# Patient Record
Sex: Female | Born: 1945 | Hispanic: Yes | Marital: Married | Smoking: Never smoker
Health system: Southern US, Community
[De-identification: ages and names within clinical notes are randomized; demographics above are authoritative.]

---

## 2020-10-17 ENCOUNTER — Emergency Department: Payer: BC Managed Care – PPO

## 2020-10-17 ENCOUNTER — Other Ambulatory Visit: Payer: Self-pay

## 2020-10-17 ENCOUNTER — Emergency Department
Admission: EM | Admit: 2020-10-17 | Discharge: 2020-10-17 | Disposition: A | Discharge status: Home / Self Care | Payer: BC Managed Care – PPO | Attending: Emergency Medicine | Admitting: Emergency Medicine

## 2020-10-17 DIAGNOSIS — R1012 Left upper quadrant pain: Secondary | ICD-10-CM | POA: Insufficient documentation

## 2020-10-17 DIAGNOSIS — R1030 Lower abdominal pain, unspecified: Secondary | ICD-10-CM

## 2020-10-17 DIAGNOSIS — I10 Essential (primary) hypertension: Secondary | ICD-10-CM | POA: Insufficient documentation

## 2020-10-17 DIAGNOSIS — R109 Unspecified abdominal pain: Secondary | ICD-10-CM

## 2020-10-17 DIAGNOSIS — R1032 Left lower quadrant pain: Secondary | ICD-10-CM | POA: Diagnosis not present

## 2020-10-17 LAB — COMPREHENSIVE METABOLIC PANEL
ALT: 17 U/L (ref 0–44)
AST: 17 U/L (ref 15–41)
Albumin: 4 g/dL (ref 3.5–5.0)
Alkaline Phosphatase: 79 U/L (ref 38–126)
Anion gap: 8 (ref 5–15)
BUN: 17 mg/dL (ref 8–23)
CO2: 27 mmol/L (ref 22–32)
Calcium: 9.2 mg/dL (ref 8.9–10.3)
Chloride: 107 mmol/L (ref 98–111)
Creatinine, Ser: 0.66 mg/dL (ref 0.44–1.00)
GFR, Estimated: >60 mL/min (ref >60)
Glucose, Bld: 116 mg/dL — ABNORMAL HIGH (ref 70–99)
Potassium: 3.6 mmol/L (ref 3.5–5.1)
Sodium: 142 mmol/L (ref 135–145)
Total Bilirubin: 0.7 mg/dL (ref 0.3–1.2)
Total Protein: 7.7 g/dL (ref 6.5–8.1)

## 2020-10-17 LAB — URINALYSIS, COMPLETE (UACMP) WITH MICROSCOPIC
Bacteria, UA: NONE SEEN
Bilirubin Urine: NEGATIVE
Glucose, UA: NEGATIVE mg/dL
Hgb urine dipstick: NEGATIVE
Ketones, ur: NEGATIVE mg/dL
Leukocytes,Ua: NEGATIVE
Nitrite: NEGATIVE
Protein, ur: NEGATIVE mg/dL
Specific Gravity, Urine: 1.016 (ref 1.005–1.030)
pH: 5 (ref 5.0–8.0)

## 2020-10-17 LAB — CBC
HCT: 40.4 % (ref 36.0–46.0)
Hemoglobin: 13.5 g/dL (ref 12.0–15.0)
MCH: 27.8 pg (ref 26.0–34.0)
MCHC: 33.4 g/dL (ref 30.0–36.0)
MCV: 83.1 fL (ref 80.0–100.0)
Platelets: 236 10*3/uL (ref 150–400)
RBC: 4.86 MIL/uL (ref 3.87–5.11)
RDW: 12.4 % (ref 11.5–15.5)
WBC: 6.8 10*3/uL (ref 4.0–10.5)
nRBC: 0 % (ref 0.0–0.2)

## 2020-10-17 LAB — LIPASE, BLOOD: Lipase: 38 U/L (ref 11–51)

## 2020-10-17 LAB — TROPONIN I (HIGH SENSITIVITY): Troponin I (High Sensitivity): 4 ng/L (ref <18)

## 2020-10-17 IMAGING — US US ABDOMEN LIMITED
1 series · 14 of 25 positions shown · non-contrast
Comparison: None.

CLINICAL DATA: 74-year-old female with upper abdominal pain.

EXAM:
ULTRASOUND ABDOMEN LIMITED RIGHT UPPER QUADRANT

[Series 1: us abdomen limited ruq (liver/gb) · 14 of 38 slices shown]
[im 1/38]
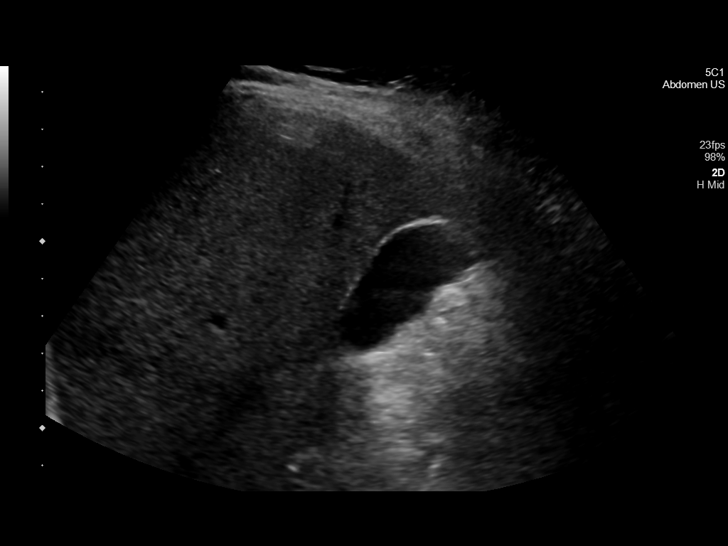
[im 4/38]
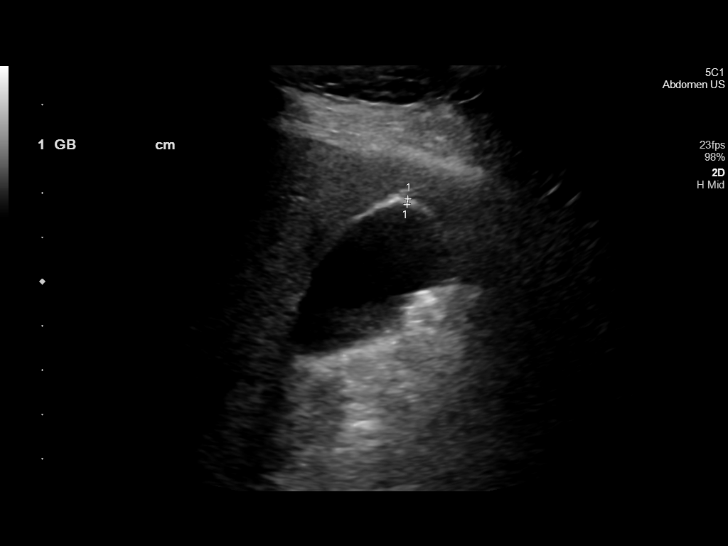
[im 7/38]
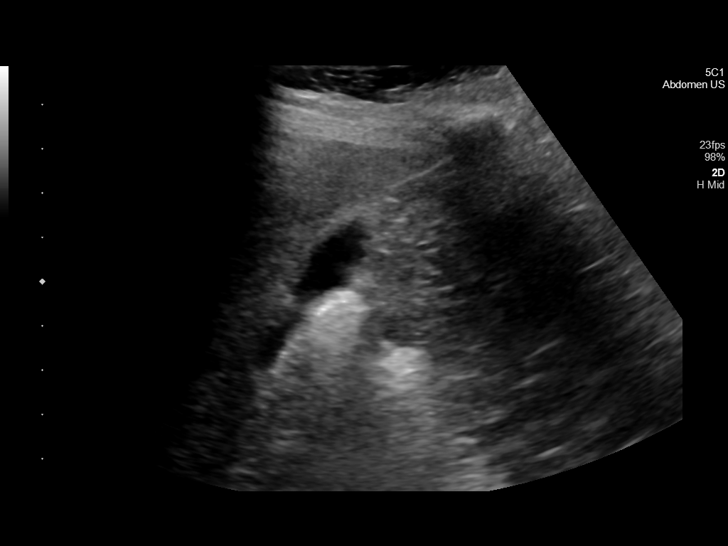
[im 10/38]
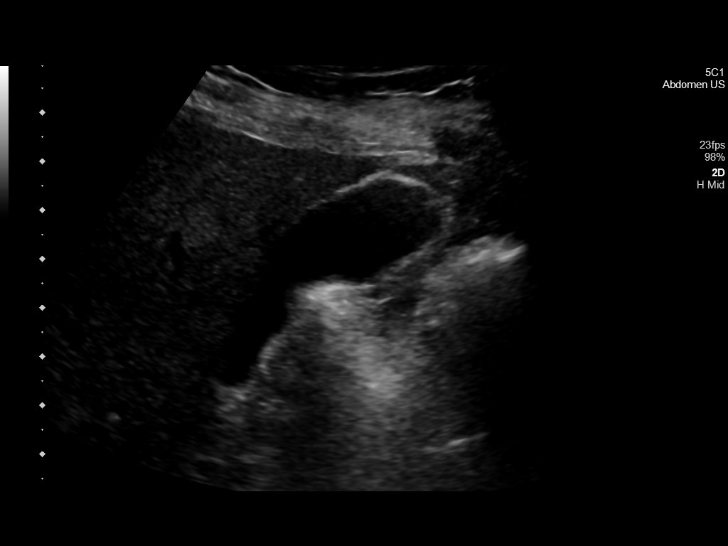
[im 13/38]
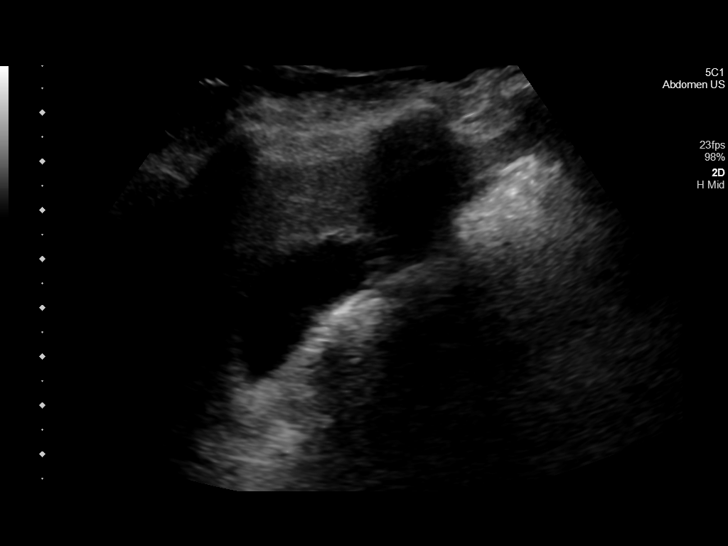
[im 14/38]
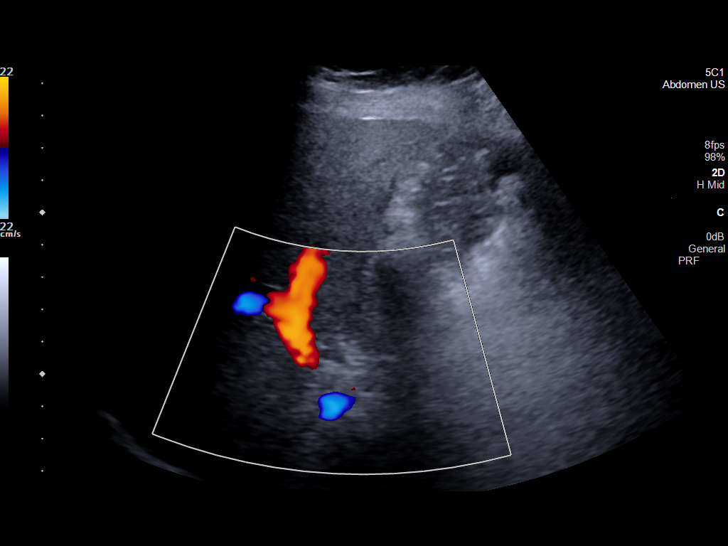
[im 17/38]
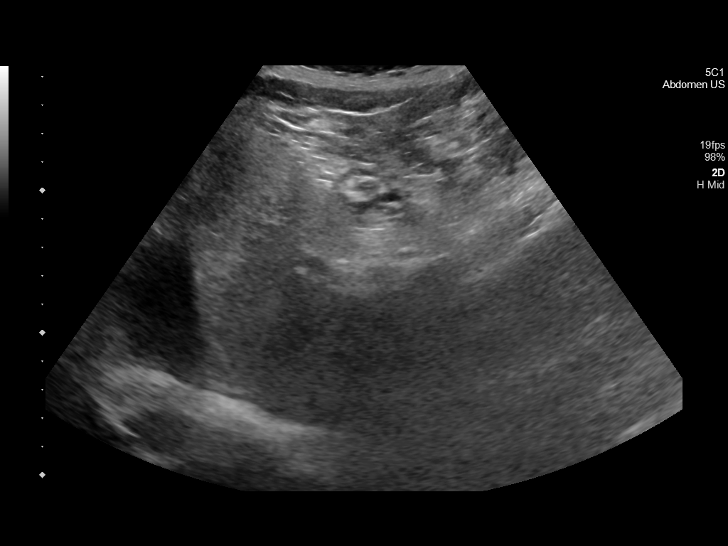
[im 21/38]
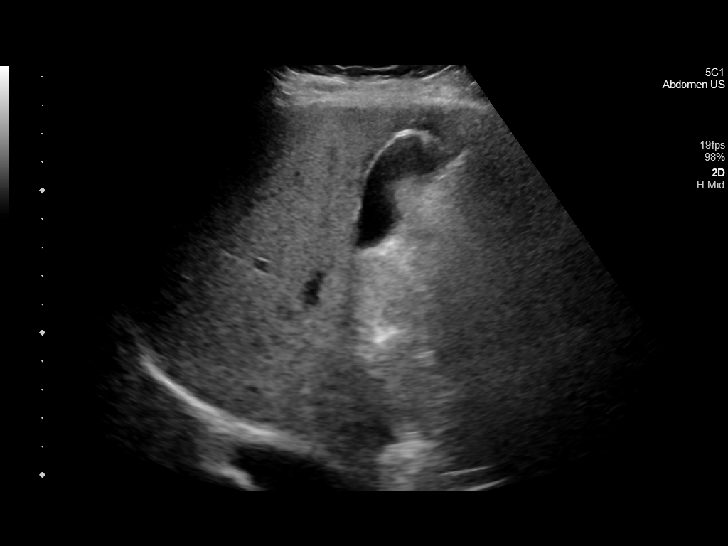
[im 24/38]
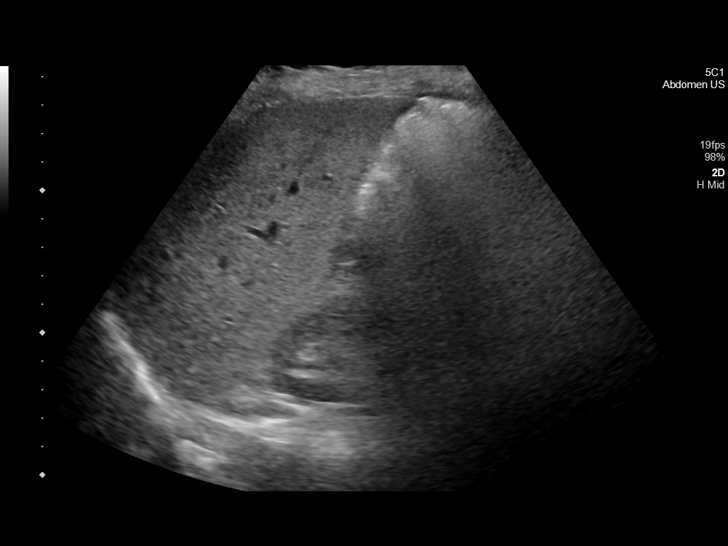
[im 25/38]
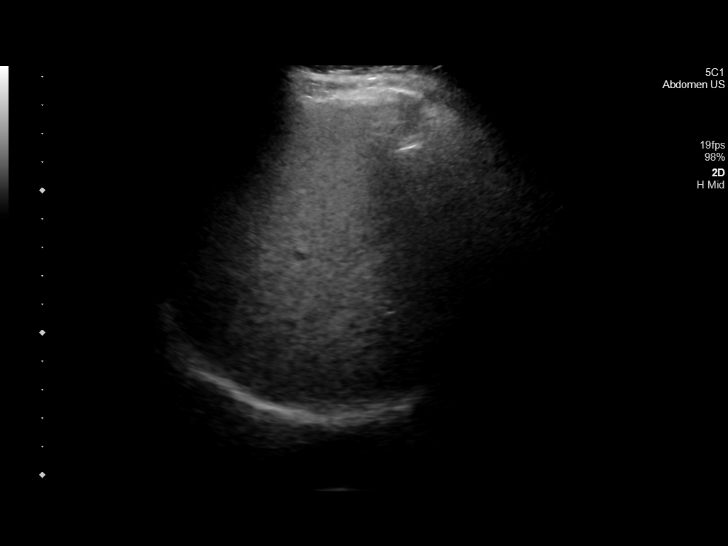
[im 28/38]
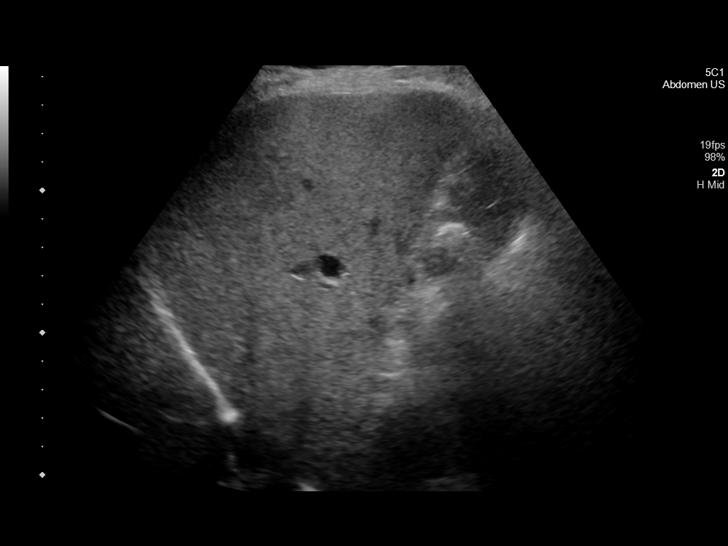
[im 31/38]
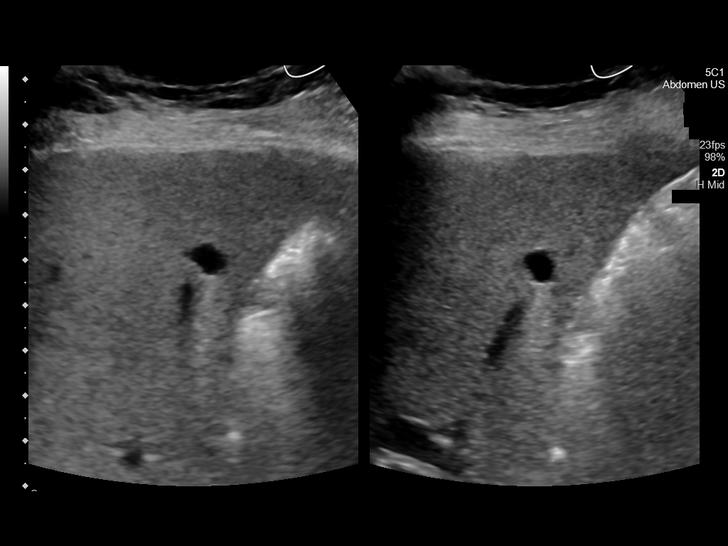
[im 34/38]
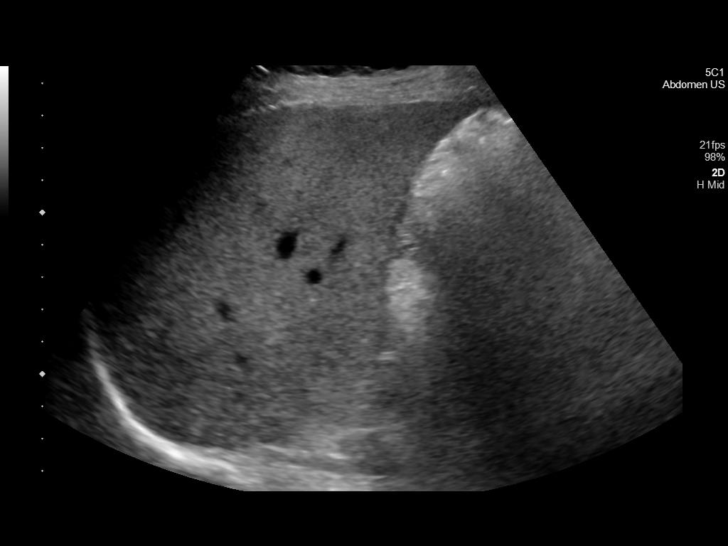
[im 38/38]
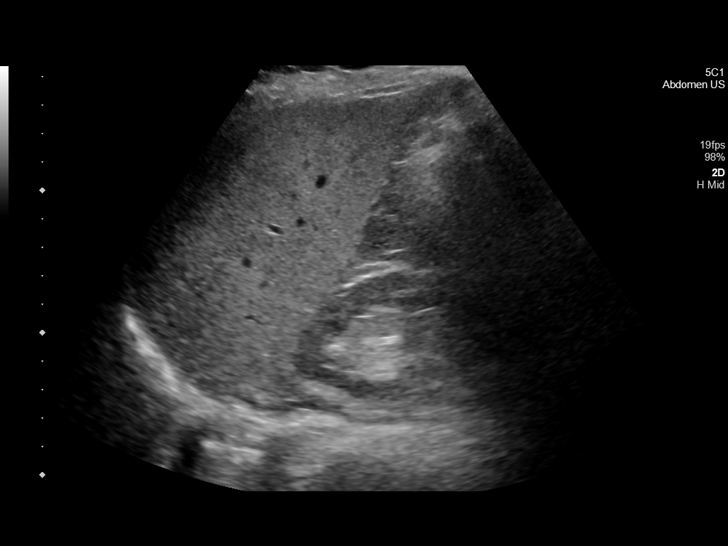

[14 of 25 positions shown; findings below may reference images not displayed]

FINDINGS: Gallbladder:

No gallstones or wall thickening visualized. No sonographic Murphy
sign noted by sonographer.

Common bile duct:

Diameter: 3 mm

Liver:

There is diffuse increased liver echogenicity most commonly seen in
the setting of fatty infiltration. Superimposed inflammation or
fibrosis is not excluded. Clinical correlation is recommended. There
is a subcentimeter cyst in the right lobe of the liver. Portal vein
is patent on color Doppler imaging with normal direction of blood
flow towards the liver.

Other: None.
IMPRESSION: 1. Fatty liver and a subcentimeter liver cysts.
2. No gallstone.

## 2020-10-17 MED ORDER — FENTANYL CITRATE (PF) 100 MCG/2ML IJ SOLN
50.0000 ug | Freq: Once | INTRAMUSCULAR | Status: AC
  Administered 2020-10-17: 50 ug via INTRAVENOUS
  Filled 2020-10-17: qty 2

## 2020-10-17 MED ORDER — LACTATED RINGERS IV BOLUS
500.0000 mL | Freq: Once | INTRAVENOUS | Status: AC
  Administered 2020-10-17: 500 mL via INTRAVENOUS

## 2020-10-17 MED ORDER — IOHEXOL 300 MG/ML  SOLN
100.0000 mL | Freq: Once | INTRAMUSCULAR | Status: AC | PRN
  Administered 2020-10-17: 100 mL via INTRAVENOUS

## 2020-10-17 NOTE — ED Provider Notes (Signed)
[facility] Emergency Department Provider Note  ____________________________________________   First MD Initiated Contact with Patient 10/17/20 1150     (approximate)  I have reviewed the triage vital signs and the nursing notes.   HISTORY  Chief Complaint Abdominal Pain   HPI Taylor Hernandez is a 74 y.o. female  with PMHx elevated BP, BPPV, allergic rhinitis who presents for assessment approximately 3 to 4 days of left upper and left lower abdominal pain.  No prior similar episodes.  No clear alleviating or aggravating factors.  Patient denies any headache, earache, sore throat, fevers, chills, cough, shortness of breath, chest pain, back pain, burning with urination, diarrhea, dysuria, rash, extremity pain, other acute complaints.  States he had a bowel movement yesterday that was a little harder than usual but otherwise has been stooling normally.  Denies EtOH or illicit drug use.  States she has had elevated blood pressure in the past but is not currently on any medications for this.      History reviewed. No pertinent past medical history.  There are no problems to display for this patient.   Past Surgical History:  Procedure Laterality Date  . CESAREAN SECTION      Prior to Admission medications   Not on File    Allergies Patient has no known allergies.  No family history on file.  Social History Social History   Tobacco Use  . Smoking status: Never Smoker  . Smokeless tobacco: Never Used  Substance Use Topics  . Alcohol use: Never  . Drug use: Not on file    Review of Systems  Review of Systems  Constitutional: Negative for chills and fever.  HENT: Negative for sore throat.   Eyes: Negative for pain.  Respiratory: Negative for cough and stridor.   Cardiovascular: Negative for chest pain.  Gastrointestinal: Positive for abdominal pain. Negative for vomiting.  Genitourinary: Negative for dysuria.  Musculoskeletal: Negative for  myalgias.  Skin: Negative for rash.  Neurological: Negative for seizures, loss of consciousness and headaches.  Psychiatric/Behavioral: Negative for suicidal ideas.  All other systems reviewed and are negative.     ____________________________________________   PHYSICAL EXAM:  VITAL SIGNS: ED Triage Vitals  Enc Vitals Group     BP 10/17/20 0228 (S) (!) 197/81     Pulse Rate 10/17/20 0228 98     Resp 10/17/20 0228 18     Temp 10/17/20 0228 97.6 F (36.4 C)     Temp src --      SpO2 10/17/20 0228 99 %     Weight 10/17/20 0224 135 lb (61.2 kg)     Height 10/17/20 0224 4\' 11"  (1.499 m)     Head Circumference --      Peak Flow --      Pain Score 10/17/20 0223 7     Pain Loc --      Pain Edu? --      Excl. in GC? --    Vitals:   10/17/20 1225 10/17/20 1230  BP: (!) 154/75 (!) 155/71  Pulse: 81 78  Resp: 16 12  Temp:    SpO2: 95% 99%   Physical Exam Vitals and nursing note reviewed.  Constitutional:      General: She is not in acute distress.    Appearance: She is well-developed.  HENT:     Head: Normocephalic and atraumatic.     Right Ear: External ear normal.     Left Ear: External ear normal.  Nose: Nose normal.  Eyes:     Conjunctiva/sclera: Conjunctivae normal.  Cardiovascular:     Rate and Rhythm: Normal rate and regular rhythm.     Heart sounds: No murmur heard.   Pulmonary:     Effort: Pulmonary effort is normal. No respiratory distress.     Breath sounds: Normal breath sounds.  Abdominal:     Palpations: Abdomen is soft.     Tenderness: There is abdominal tenderness in the left upper quadrant and left lower quadrant. There is no right CVA tenderness or left CVA tenderness.  Musculoskeletal:     Cervical back: Neck supple.  Skin:    General: Skin is warm and dry.     Capillary Refill: Capillary refill takes less than 2 seconds.  Neurological:     Mental Status: She is alert and oriented to person, place, and time.  Psychiatric:        Mood and  Affect: Mood normal.      ____________________________________________   LABS (all labs ordered are listed, but only abnormal results are displayed)  Labs Reviewed  COMPREHENSIVE METABOLIC PANEL - Abnormal; Notable for the following components:      Result Value   Glucose, Bld 116 (*)    All other components within normal limits  URINALYSIS, COMPLETE (UACMP) WITH MICROSCOPIC - Abnormal; Notable for the following components:   Color, Urine YELLOW (*)    APPearance CLEAR (*)    All other components within normal limits  LIPASE, BLOOD  CBC  TROPONIN I (HIGH SENSITIVITY)   ____________________________________________  EKG  Sinus rhythm with a ventricular rate of 86, normal axis, nonspecific changes in leads III, V5, and V6 with no other clear evidence of acute ischemia.  Normal intervals. ____________________________________________  RADIOLOGY  ED MD interpretation: No evidence of cholelithiasis.  Official radiology report(s): CT ABDOMEN PELVIS W CONTRAST  Result Date: 10/17/2020 CLINICAL DATA:  Left abdominal pain EXAM: CT ABDOMEN AND PELVIS WITH CONTRAST TECHNIQUE: Multidetector CT imaging of the abdomen and pelvis was performed using the standard protocol following bolus administration of intravenous contrast. CONTRAST:  OMNIPAQUE IOHEXOL 300 MG/ML  SOLN COMPARISON:  None. FINDINGS: Lower chest: No acute abnormality. Hepatobiliary: Too small to characterize hypoattenuating lesion of the inferior right hepatic lobe. Gallbladder is unremarkable. There is no biliary dilatation. Pancreas: Unremarkable. Spleen: Unremarkable. Adrenals/Urinary Tract: Adrenals, kidneys, and bladder are unremarkable Stomach/Bowel: Stomach is within normal limits. Bowel is normal in caliber. Vascular/Lymphatic: Minor aortic atherosclerosis. No enlarged lymph nodes identified. Reproductive: Uterus and bilateral adnexa are unremarkable. Other: No ascites.  No acute abnormality of the abdominal wall.  Musculoskeletal: Lumbar spine degenerative changes. No acute osseous abnormality. IMPRESSION: No acute abnormality or findings to account for reported symptoms. Electronically Signed   By: Guadlupe Spanish M.D.   On: 10/17/2020 12:58   US ABDOMEN LIMITED RUQ (LIVER/GB)  Result Date: 10/17/2020 CLINICAL DATA:  74 year old female with upper abdominal pain. EXAM: ULTRASOUND ABDOMEN LIMITED RIGHT UPPER QUADRANT COMPARISON:  None. FINDINGS: Gallbladder: No gallstones or wall thickening visualized. No sonographic Murphy sign noted by sonographer. Common bile duct: Diameter: 3 mm Liver: There is diffuse increased liver echogenicity most commonly seen in the setting of fatty infiltration. Superimposed inflammation or fibrosis is not excluded. Clinical correlation is recommended. There is a subcentimeter cyst in the right lobe of the liver. Portal vein is patent on color Doppler imaging with normal direction of blood flow towards the liver. Other: None. IMPRESSION: 1. Fatty liver and a subcentimeter liver cysts. 2.  No gallstone. Electronically Signed   By: Elgie Collard M.D.   On: 10/17/2020 03:52    ____________________________________________   PROCEDURES  Procedure(s) performed (including Critical Care):  .1-3 Lead EKG Interpretation Performed by: Gilles Chiquito, MD Authorized by: Gilles Chiquito, MD     Interpretation: normal     ECG rate assessment: normal     Rhythm: sinus rhythm     Ectopy: none     Conduction: normal       ____________________________________________   INITIAL IMPRESSION / ASSESSMENT AND PLAN / ED COURSE        Patient presents with [place] to history exam for assessment of some left-sided abdominal pain has been ongoing over the last several days.  Patient is hypertensive with a BP of 197/81 with otherwise stable vital signs on room air.  Exam as above remarkable for some tenderness in left upper and left lower quadrants.  Differential includes but is not limited  to ACS, AAA, diverticulitis, stone, pyelonephritis, appendicitis, splenic infarct, gastritis, and cystitis.  Low suspicion for ACS given patient denies any chest pain has a reassuring EKG with nonelevated troponin.  Ultrasound obtained in triage shows no evidence of cholelithiasis or other abnormalities in the right upper quadrant other than some steatosis with possible cyst.  Lipase is not consistent with acute pancreatitis.  UA does not appear infected and exam is not consistent with pyelonephritis.  CT abdomen pelvis shows no evidence of pyelonephritis, diverticulitis, splenic injury, SBO, appendicitis, or other acute intra-abdominal pathology.  Given patient did report some very hard stools yesterday is possible she has some gas pain and constipation.  Advised patient to take some MiraLAX and follow-up with her PCP in the next 2 to 3 days.  Given she states she felt somewhat better on my reassessment with otherwise reassuring vital signs and work-up believe she is a for discharge with plan for close outpatient follow-up.  Discharge stable condition.  Strict return precautions advised and discussed.  Also advised patient that she was hypertensive today and should have her blood pressure rechecked by her PCP on her follow-up visit.  ____________________________________________   FINAL CLINICAL IMPRESSION(S) / ED DIAGNOSES  Final diagnoses:  Lower abdominal pain  Abdominal pain, unspecified abdominal location  Hypertension, unspecified type    Medications  fentaNYL (SUBLIMAZE) injection 50 mcg (50 mcg Intravenous Given 10/17/20 1223)  lactated ringers bolus 500 mL (500 mLs Intravenous New Bag/Given 10/17/20 1220)  iohexol (OMNIPAQUE) 300 MG/ML solution 100 mL (100 mLs Intravenous Contrast Given 10/17/20 1242)     ED Discharge Orders    None       Note:  This document was prepared using Dragon voice recognition software and may include unintentional dictation errors.   Gilles Chiquito, MD 10/17/20 1314

## 2020-10-17 NOTE — ED Triage Notes (Signed)
Patient c/o upper left abdominal pain and swelling X 4 days. Patient denies N/V/D.

## 2023-01-10 ENCOUNTER — Telehealth: Payer: Self-pay | Admitting: General Practice

## 2023-01-10 NOTE — Telephone Encounter (Signed)
Patient husband called in and was wanting to know if Dr. Darnell Level would take him and his wife on as new patients. He stated that right now they currently go to [facility] and are looking for a spanish speaking doctor closer to home. He stated that someone had gave them his card a while back. Please advise. Thank you!

## 2023-01-23 NOTE — Telephone Encounter (Signed)
Pt's husband called to check status of Dr. Darnell Level accepting pt & husband as new pts. Call back # [phone]

## 2023-01-23 NOTE — Telephone Encounter (Signed)
Unfortunately I'm not taking on new patients at this time.

## 2023-01-24 NOTE — Telephone Encounter (Signed)
Told pt's husband Dr. Synthia Innocent response, no questions/concerns .

## 2023-09-23 ENCOUNTER — Other Ambulatory Visit: Payer: Self-pay

## 2023-09-23 ENCOUNTER — Emergency Department (HOSPITAL_COMMUNITY): Payer: BC Managed Care – PPO

## 2023-09-23 ENCOUNTER — Encounter (HOSPITAL_COMMUNITY): Payer: Self-pay

## 2023-09-23 ENCOUNTER — Emergency Department (HOSPITAL_COMMUNITY)
Admission: EM | Admit: 2023-09-23 | Discharge: 2023-09-24 | Disposition: A | Discharge status: Home / Self Care | Payer: BC Managed Care – PPO | Attending: Emergency Medicine | Admitting: Emergency Medicine

## 2023-09-23 DIAGNOSIS — W01198A Fall on same level from slipping, tripping and stumbling with subsequent striking against other object, initial encounter: Secondary | ICD-10-CM | POA: Diagnosis not present

## 2023-09-23 DIAGNOSIS — S0083XA Contusion of other part of head, initial encounter: Secondary | ICD-10-CM | POA: Insufficient documentation

## 2023-09-23 DIAGNOSIS — S0011XA Contusion of right eyelid and periocular area, initial encounter: Secondary | ICD-10-CM | POA: Diagnosis not present

## 2023-09-23 DIAGNOSIS — S43004A Unspecified dislocation of right shoulder joint, initial encounter: Secondary | ICD-10-CM

## 2023-09-23 DIAGNOSIS — S43014A Anterior dislocation of right humerus, initial encounter: Secondary | ICD-10-CM | POA: Insufficient documentation

## 2023-09-23 DIAGNOSIS — W19XXXA Unspecified fall, initial encounter: Secondary | ICD-10-CM

## 2023-09-23 DIAGNOSIS — S4991XA Unspecified injury of right shoulder and upper arm, initial encounter: Secondary | ICD-10-CM | POA: Diagnosis present

## 2023-09-23 MED ORDER — IBUPROFEN 400 MG PO TABS
400.0000 mg | ORAL_TABLET | Freq: Once | ORAL | Status: AC | PRN
  Administered 2023-09-23: 400 mg via ORAL
  Filled 2023-09-23: qty 1

## 2023-09-23 NOTE — ED Triage Notes (Signed)
Pt coming to ED after falling face forward onto floor in hospital while visiting husband. Pt denies dizziness/lightheadedness/syncope prior to fall. Reports she tripped over feet from standing position and hit forehead above right eye on floor. No open wounds, mild swelling on right eyebrow. Denies LOC. Denies being on blood thinners. Reports pain to Rt shoulder, unable to move arm up. At this time. Peripheral pulses intact, no numbness/tingling noted.

## 2023-09-24 ENCOUNTER — Emergency Department (HOSPITAL_COMMUNITY): Payer: BC Managed Care – PPO

## 2023-09-24 DIAGNOSIS — S43014A Anterior dislocation of right humerus, initial encounter: Secondary | ICD-10-CM | POA: Diagnosis not present

## 2023-09-24 MED ORDER — IBUPROFEN 400 MG PO TABS
600.0000 mg | ORAL_TABLET | Freq: Once | ORAL | Status: AC
  Administered 2023-09-24: 600 mg via ORAL
  Filled 2023-09-24: qty 1

## 2023-09-24 MED ORDER — MIDAZOLAM HCL 2 MG/ML PO SYRP: 2.0000 mg | ORAL_SOLUTION | Freq: Once | ORAL | Status: DC

## 2023-09-24 NOTE — ED Notes (Addendum)
Radiology called as requested by Karene Fry MD for right shoulder image

## 2023-09-24 NOTE — Discharge Instructions (Addendum)
Recommend you follow-up outpatient as needed with a primary care provider, wear a sling for comfort for the next week, NSAIDs for pain control as needed.

## 2023-09-24 NOTE — ED Provider Notes (Signed)
[place] EMERGENCY DEPARTMENT AT [facility] Provider Note   CSN: [id number] Arrival date & time: 09/23/23  1707     History  Chief Complaint  Patient presents with   Taylor Hernandez is a 77 y.o. female.   Fall     77 year old female presenting to the emergency department as a nonlevel trauma after a ground-level mechanical fall.  The patient states that she was in the hospital visiting a family member when she tripped and fell forward from a standing position hitting her forehead above the right eye on the floor.  She did land on her right shoulder and has dislocated her right shoulder and is unable to move the extremity.  She denies any loss of consciousness, is not on anticoagulation.  She denies any other injuries or complaints.  He arrives GCS 15, ABC intact.  Home Medications Prior to Admission medications   Medication Sig Start Date End Date Taking? Authorizing Provider  albuterol (VENTOLIN HFA) 108 (90 Base) MCG/ACT inhaler Inhale 2 puffs into the lungs every 6 (six) hours as needed for wheezing. 06/30/23  Yes [provider]  atorvastatin (LIPITOR) 40 MG tablet Take 40 mg by mouth daily.    [provider]  fluticasone (FLONASE) 50 MCG/ACT nasal spray Place 1 spray into both nostrils daily.    [provider]      Allergies    Patient has no known allergies.    Review of Systems   Review of Systems  All other systems reviewed and are negative.   Physical Exam Updated Vital Signs BP (!) 157/69   Pulse 74   Temp 97.7 F (36.5 C)   Resp 18   Ht 4\' 10"  (1.473 m)   Wt 61.2 kg   SpO2 95%   BMI 28.22 kg/m  Physical Exam Vitals and nursing note reviewed.  Constitutional:      General: She is not in acute distress.    Appearance: She is well-developed.     Comments: GCS 15, ABC intact  HENT:     Head: Normocephalic.     Comments: Small hematoma to the right eyebrow Eyes:     Extraocular Movements: Extraocular  movements intact.     Conjunctiva/sclera: Conjunctivae normal.     Pupils: Pupils are equal, round, and reactive to light.  Neck:     Comments: No midline tenderness to palpation of the cervical spine.  Range of motion intact Cardiovascular:     Rate and Rhythm: Normal rate and regular rhythm.  Pulmonary:     Effort: Pulmonary effort is normal. No respiratory distress.     Breath sounds: Normal breath sounds.  Chest:     Comments: Clavicles stable nontender to AP compression.  Chest wall stable and nontender to AP and lateral compression. Abdominal:     Palpations: Abdomen is soft.     Tenderness: There is no abdominal tenderness.     Comments: Pelvis stable to lateral compression  Musculoskeletal:     Cervical back: Neck supple.     Comments: No midline tenderness to palpation of the thoracic or lumbar spine.  Extremities atraumatic with intact range of motion with the exception of right shoulder with decreased range of motion, tender to palpation, neurovascular intact with 2+ radial pulses, intact motor function along the median, ulnar, radial nerve distributions  Skin:    General: Skin is warm and dry.  Neurological:     Mental Status: She is alert.  Comments: Cranial nerves II through XII grossly intact.  Moving all 4 extremities spontaneously.  Sensation grossly intact all 4 extremities     ED Results / Procedures / Treatments   Labs (all labs ordered are listed, but only abnormal results are displayed) Labs Reviewed - No data to display  EKG None  Radiology DG Shoulder Right  Result Date: 09/23/2023 CLINICAL DATA:  Status post fall. EXAM: RIGHT SHOULDER - 2+ VIEW COMPARISON:  None Available. FINDINGS: There is anterior dislocation of the right humeral head with respect to the right glenoid. Acute fracture is not identified. Mild degenerative changes are seen involving the right acromioclavicular joint. Soft tissues are unremarkable. IMPRESSION: Anterior right shoulder  dislocation. Electronically Signed   By: Aram Candela M.D.   On: 09/23/2023 20:55   DG Humerus Right  Result Date: 09/23/2023 CLINICAL DATA:  Status post fall. EXAM: RIGHT HUMERUS - 2+ VIEW COMPARISON:  None Available. FINDINGS: There is anterior dislocation of the right humeral head with respect to the right glenoid. No acute fractures identified. Soft tissues are unremarkable. IMPRESSION: Anterior right shoulder dislocation. Electronically Signed   By: Aram Candela M.D.   On: 09/23/2023 20:53   CT Head Wo Contrast  Result Date: 09/23/2023 CLINICAL DATA:  Facial trauma, blunt.  Fall. EXAM: CT HEAD WITHOUT CONTRAST CT CERVICAL SPINE WITHOUT CONTRAST TECHNIQUE: Multidetector CT imaging of the head and cervical spine was performed following the standard protocol without intravenous contrast. Multiplanar CT image reconstructions of the cervical spine were also generated. RADIATION DOSE REDUCTION: This exam was performed according to the departmental dose-optimization program which includes automated exposure control, adjustment of the mA and/or kV according to patient size and/or use of iterative reconstruction technique. COMPARISON:  None Available. FINDINGS: CT HEAD FINDINGS Brain: No acute intracranial hemorrhage. Gray-white differentiation is preserved. No hydrocephalus or extra-axial collection. No mass effect or midline shift. Vascular: No hyperdense vessel or unexpected calcification. Skull: No calvarial fracture or suspicious bone lesion. Skull base is unremarkable. Sinuses/Orbits: No acute finding. Other: None. CT CERVICAL SPINE FINDINGS Alignment: Normal. Skull base and vertebrae: No acute fracture. Normal craniocervical junction. No suspicious bone lesions. Soft tissues and spinal canal: No prevertebral fluid or swelling. No visible canal hematoma. Disc levels:  No significant degenerative change. Upper chest: No acute findings. Other: None. IMPRESSION: 1. No acute intracranial abnormality.  2. No acute cervical spine fracture or traumatic listhesis. Electronically Signed   By: Orvan Falconer M.D.   On: 09/23/2023 20:50   CT Cervical Spine Wo Contrast  Result Date: 09/23/2023 CLINICAL DATA:  Facial trauma, blunt.  Fall. EXAM: CT HEAD WITHOUT CONTRAST CT CERVICAL SPINE WITHOUT CONTRAST TECHNIQUE: Multidetector CT imaging of the head and cervical spine was performed following the standard protocol without intravenous contrast. Multiplanar CT image reconstructions of the cervical spine were also generated. RADIATION DOSE REDUCTION: This exam was performed according to the departmental dose-optimization program which includes automated exposure control, adjustment of the mA and/or kV according to patient size and/or use of iterative reconstruction technique. COMPARISON:  None Available. FINDINGS: CT HEAD FINDINGS Brain: No acute intracranial hemorrhage. Gray-white differentiation is preserved. No hydrocephalus or extra-axial collection. No mass effect or midline shift. Vascular: No hyperdense vessel or unexpected calcification. Skull: No calvarial fracture or suspicious bone lesion. Skull base is unremarkable. Sinuses/Orbits: No acute finding. Other: None. CT CERVICAL SPINE FINDINGS Alignment: Normal. Skull base and vertebrae: No acute fracture. Normal craniocervical junction. No suspicious bone lesions. Soft tissues and spinal canal: No prevertebral fluid  or swelling. No visible canal hematoma. Disc levels:  No significant degenerative change. Upper chest: No acute findings. Other: None. IMPRESSION: 1. No acute intracranial abnormality. 2. No acute cervical spine fracture or traumatic listhesis. Electronically Signed   By: Orvan Falconer M.D.   On: 09/23/2023 20:50    Procedures .Ortho Injury Treatment  Date/Time: 09/24/2023 2:35 AM  Performed by: Ernie Avena, MD Authorized by: Ernie Avena, MD   Consent:    Consent obtained:  Verbal   Consent given by:  Patient   Risks discussed:   Irreducible dislocationInjury location: shoulder Location details: right shoulder Injury type: dislocation Dislocation type: anterior Hill-Sachs deformity: no Chronicity: new Pre-procedure neurovascular assessment: neurovascularly intact Pre-procedure distal perfusion: normal Pre-procedure neurological function: normal Pre-procedure range of motion: reduced  Anesthesia: Local anesthesia used: no  Patient sedated: NoManipulation performed: yes Reduction method: Stimson maneuver and scapular manipulation Reduction successful: yes X-ray confirmed reduction: yes Immobilization: sling Post-procedure neurovascular assessment: post-procedure neurovascularly intact       Medications Ordered in ED Medications  ibuprofen (ADVIL) tablet 400 mg (400 mg Oral Given 09/23/23 1851)  ibuprofen (ADVIL) tablet 600 mg (600 mg Oral Given 09/24/23 0320)    ED Course/ Medical Decision Making/ A&P                                 Medical Decision Making Amount and/or Complexity of Data Reviewed Radiology: ordered.  Risk Prescription drug management.    77 year old female presenting to the emergency department as a nonlevel trauma after a ground-level mechanical fall.  The patient states that she was in the hospital visiting a family member when she tripped and fell forward from a standing position hitting her forehead above the right eye on the floor.  She did land on her right shoulder and has dislocated her right shoulder and is unable to move the extremity.  She denies any loss of consciousness, is not on anticoagulation.  She denies any other injuries or complaints.  He arrives GCS 15, ABC intact.  On arrival, the patient was vitally stable, presenting with right forehead hematoma and likely shoulder dislocation versus fracture after a fall.  Triage obtained imaging, will follow-up CT imaging of the head, cervical spine, x-ray imaging of the right shoulder and humerus and reassess.  CT head  and cervical spine: No fracture or dislocation of the cervical spine, no acute intracranial abnormality   XR Right Shoulder:  IMPRESSION:  Anterior right shoulder dislocation.   DG Right Humerus: IMPRESSION:  Anterior right shoulder dislocation.   Reduction was performed formed using the Stimson maneuver with associated scapular manipulation with successful reduction of the patient's anterior shoulder dislocation.  The patient was provided NSAIDs for pain control. Repeat XR confirmed reduction. Overall trauma workup at this time is reassuring, shoulder reduced, plan for outpatient follow-up.  Stable for discharge.  Final Clinical Impression(s) / ED Diagnoses Final diagnoses:  Fall, initial encounter  Contusion of face, initial encounter  Dislocation of right shoulder joint, initial encounter    Rx / DC Orders ED Discharge Orders     None         Ernie Avena, MD 09/24/23 [phone]
